# Patient Record
Sex: Female | Born: 2005
Health system: Southern US, Community
[De-identification: ages and names within clinical notes are randomized; demographics above are authoritative.]

## PROBLEM LIST (undated history)

## (undated) DIAGNOSIS — Z9221 Personal history of antineoplastic chemotherapy: Secondary | ICD-10-CM

## (undated) DIAGNOSIS — R51 Headache: Secondary | ICD-10-CM

## (undated) DIAGNOSIS — D496 Neoplasm of unspecified behavior of brain: Secondary | ICD-10-CM

## (undated) DIAGNOSIS — R519 Headache, unspecified: Secondary | ICD-10-CM

## (undated) DIAGNOSIS — D649 Anemia, unspecified: Secondary | ICD-10-CM

## (undated) DIAGNOSIS — K900 Celiac disease: Secondary | ICD-10-CM

## (undated) DIAGNOSIS — E109 Type 1 diabetes mellitus without complications: Secondary | ICD-10-CM

## (undated) HISTORY — DX: Anemia, unspecified: D64.9

## (undated) HISTORY — DX: Type 1 diabetes mellitus without complications: E10.9

## (undated) HISTORY — DX: Headache: R51

## (undated) HISTORY — DX: Headache, unspecified: R51.9

## (undated) HISTORY — DX: Celiac disease: K90.0

---
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
## 2024-01-28 ENCOUNTER — Ambulatory Visit: Admit: 2024-01-28 | Payer: Self-pay

## 2024-01-28 SURGERY — COLONOSCOPY
Anesthesia: General
